# Patient Record
Sex: Male | Born: 1960 | Race: Black or African American | Hispanic: No | Marital: Single | Smoking: Current some day smoker
Health system: Southern US, Community
[De-identification: ages and names within clinical notes are randomized; demographics above are authoritative.]

## PROBLEM LIST (undated history)

## (undated) DIAGNOSIS — S92001B Unspecified fracture of right calcaneus, initial encounter for open fracture: Secondary | ICD-10-CM

## (undated) DIAGNOSIS — S62309A Unspecified fracture of unspecified metacarpal bone, initial encounter for closed fracture: Secondary | ICD-10-CM

## (undated) DIAGNOSIS — S82201A Unspecified fracture of shaft of right tibia, initial encounter for closed fracture: Secondary | ICD-10-CM

## (undated) DIAGNOSIS — S82891A Other fracture of right lower leg, initial encounter for closed fracture: Secondary | ICD-10-CM

## (undated) DIAGNOSIS — I10 Essential (primary) hypertension: Secondary | ICD-10-CM

## (undated) HISTORY — PX: PROSTATE SURGERY: SHX751

---

## 1997-12-31 ENCOUNTER — Encounter: Admission: RE | Admit: 1997-12-31 | Discharge: 1998-02-17 | Payer: Self-pay | Admitting: Anesthesiology

## 2000-04-18 ENCOUNTER — Emergency Department (HOSPITAL_COMMUNITY): Admission: EM | Admit: 2000-04-18 | Discharge: 2000-04-18 | Payer: Self-pay | Admitting: Emergency Medicine

## 2000-04-18 ENCOUNTER — Encounter: Payer: Self-pay | Admitting: Emergency Medicine

## 2003-03-27 ENCOUNTER — Ambulatory Visit (HOSPITAL_COMMUNITY): Admission: RE | Admit: 2003-03-27 | Discharge: 2003-03-27 | Payer: Self-pay | Admitting: Gastroenterology

## 2014-12-02 ENCOUNTER — Emergency Department (HOSPITAL_COMMUNITY): Payer: 59

## 2014-12-02 ENCOUNTER — Inpatient Hospital Stay (HOSPITAL_COMMUNITY)
Admission: EM | Admit: 2014-12-02 | Discharge: 2014-12-05 | DRG: 493 | Disposition: A | Discharge status: Home / Self Care | Payer: 59 | Attending: Orthopedic Surgery | Admitting: Orthopedic Surgery

## 2014-12-02 ENCOUNTER — Encounter (HOSPITAL_COMMUNITY): Payer: Self-pay | Admitting: Emergency Medicine

## 2014-12-02 DIAGNOSIS — F17290 Nicotine dependence, other tobacco product, uncomplicated: Secondary | ICD-10-CM | POA: Diagnosis present

## 2014-12-02 DIAGNOSIS — S82391A Other fracture of lower end of right tibia, initial encounter for closed fracture: Secondary | ICD-10-CM | POA: Diagnosis not present

## 2014-12-02 DIAGNOSIS — S82891A Other fracture of right lower leg, initial encounter for closed fracture: Secondary | ICD-10-CM | POA: Diagnosis present

## 2014-12-02 DIAGNOSIS — S8261XA Displaced fracture of lateral malleolus of right fibula, initial encounter for closed fracture: Secondary | ICD-10-CM | POA: Diagnosis present

## 2014-12-02 DIAGNOSIS — S62309A Unspecified fracture of unspecified metacarpal bone, initial encounter for closed fracture: Secondary | ICD-10-CM | POA: Diagnosis present

## 2014-12-02 DIAGNOSIS — S82209A Unspecified fracture of shaft of unspecified tibia, initial encounter for closed fracture: Secondary | ICD-10-CM

## 2014-12-02 DIAGNOSIS — S82201A Unspecified fracture of shaft of right tibia, initial encounter for closed fracture: Secondary | ICD-10-CM | POA: Diagnosis present

## 2014-12-02 DIAGNOSIS — R52 Pain, unspecified: Secondary | ICD-10-CM

## 2014-12-02 DIAGNOSIS — S92001B Unspecified fracture of right calcaneus, initial encounter for open fracture: Secondary | ICD-10-CM | POA: Diagnosis present

## 2014-12-02 DIAGNOSIS — Z419 Encounter for procedure for purposes other than remedying health state, unspecified: Secondary | ICD-10-CM

## 2014-12-02 DIAGNOSIS — F17210 Nicotine dependence, cigarettes, uncomplicated: Secondary | ICD-10-CM | POA: Diagnosis present

## 2014-12-02 DIAGNOSIS — S62202A Unspecified fracture of first metacarpal bone, left hand, initial encounter for closed fracture: Secondary | ICD-10-CM | POA: Diagnosis present

## 2014-12-02 DIAGNOSIS — Z79899 Other long term (current) drug therapy: Secondary | ICD-10-CM

## 2014-12-02 DIAGNOSIS — S92041B Displaced other fracture of tuberosity of right calcaneus, initial encounter for open fracture: Secondary | ICD-10-CM | POA: Diagnosis present

## 2014-12-02 DIAGNOSIS — I10 Essential (primary) hypertension: Secondary | ICD-10-CM | POA: Diagnosis present

## 2014-12-02 HISTORY — DX: Essential (primary) hypertension: I10

## 2014-12-02 HISTORY — DX: Unspecified fracture of right calcaneus, initial encounter for open fracture: S92.001B

## 2014-12-02 HISTORY — DX: Other fracture of right lower leg, initial encounter for closed fracture: S82.891A

## 2014-12-02 HISTORY — DX: Unspecified fracture of unspecified metacarpal bone, initial encounter for closed fracture: S62.309A

## 2014-12-02 HISTORY — DX: Unspecified fracture of shaft of right tibia, initial encounter for closed fracture: S82.201A

## 2014-12-02 LAB — I-STAT CHEM 8, ED
BUN: 17 mg/dL (ref 6–20)
Calcium, Ion: 1.17 mmol/L (ref 1.12–1.23)
Chloride: 103 mmol/L (ref 101–111)
Creatinine, Ser: 1.6 mg/dL — ABNORMAL HIGH (ref 0.61–1.24)
Glucose, Bld: 123 mg/dL — ABNORMAL HIGH (ref 65–99)
HCT: 45 % (ref 39.0–52.0)
Hemoglobin: 15.3 g/dL (ref 13.0–17.0)
Potassium: 3.6 mmol/L (ref 3.5–5.1)
Sodium: 142 mmol/L (ref 135–145)
TCO2: 25 mmol/L (ref 0–100)

## 2014-12-02 LAB — CBC WITH DIFFERENTIAL/PLATELET
BASOS PCT: 0 % (ref 0–1)
Basophils Absolute: 0 10*3/uL (ref 0.0–0.1)
EOS ABS: 0.1 10*3/uL (ref 0.0–0.7)
EOS PCT: 1 % (ref 0–5)
HCT: 43.4 % (ref 39.0–52.0)
HEMOGLOBIN: 15.4 g/dL (ref 13.0–17.0)
LYMPHS ABS: 2.4 10*3/uL (ref 0.7–4.0)
Lymphocytes Relative: 22 % (ref 12–46)
MCH: 32.7 pg (ref 26.0–34.0)
MCHC: 35.5 g/dL (ref 30.0–36.0)
MCV: 92.1 fL (ref 78.0–100.0)
MONO ABS: 0.8 10*3/uL (ref 0.1–1.0)
MONOS PCT: 8 % (ref 3–12)
Neutro Abs: 7.3 10*3/uL (ref 1.7–7.7)
Neutrophils Relative %: 69 % (ref 43–77)
Platelets: 155 10*3/uL (ref 150–400)
RBC: 4.71 MIL/uL (ref 4.22–5.81)
RDW: 14 % (ref 11.5–15.5)
WBC: 10.6 10*3/uL — ABNORMAL HIGH (ref 4.0–10.5)

## 2014-12-02 MED ORDER — HYDROMORPHONE HCL 1 MG/ML IJ SOLN
1.0000 mg | Freq: Once | INTRAMUSCULAR | Status: AC
  Administered 2014-12-02: 1 mg via INTRAVENOUS
  Filled 2014-12-02: qty 1

## 2014-12-02 MED ORDER — ONDANSETRON HCL 4 MG/2ML IJ SOLN
4.0000 mg | Freq: Once | INTRAMUSCULAR | Status: AC
  Administered 2014-12-02: 4 mg via INTRAVENOUS
  Filled 2014-12-02: qty 2

## 2014-12-02 MED ORDER — IOHEXOL 300 MG/ML  SOLN
100.0000 mL | Freq: Once | INTRAMUSCULAR | Status: AC | PRN
  Administered 2014-12-02: 100 mL via INTRAVENOUS

## 2014-12-02 MED ORDER — SODIUM CHLORIDE 0.9 % IV BOLUS (SEPSIS)
500.0000 mL | Freq: Once | INTRAVENOUS | Status: AC
  Administered 2014-12-02: 500 mL via INTRAVENOUS

## 2014-12-02 MED ORDER — METHOCARBAMOL 500 MG PO TABS
1000.0000 mg | ORAL_TABLET | Freq: Once | ORAL | Status: AC
  Administered 2014-12-02: 1000 mg via ORAL
  Filled 2014-12-02: qty 2

## 2014-12-02 NOTE — ED Notes (Signed)
Ortho tech at bedside stabilizing right lower leg.

## 2014-12-02 NOTE — ED Notes (Signed)
Patient transported to X-ray & CT °
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
## 2014-12-02 NOTE — Progress Notes (Signed)
Orthopedic Tech Progress Note Patient Details:  John Cantu 1961/02/24 [id number]  Ortho Devices Type of Ortho Device: Thumb spica splint Splint Material: Fiberglass Ortho Device/Splint Location: LUE Ortho Device/Splint Interventions: Ordered, Application   [name] 12/02/2014, 10:50 PM

## 2014-12-02 NOTE — Progress Notes (Signed)
Orthopedic Tech Progress Note Patient Details:  John Cantu 05-10-1961 [id number]  Ortho Devices Type of Ortho Device: Ace wrap, Post (short leg) splint, Stirrup splint Ortho Device/Splint Location: RLE Ortho Device/Splint Interventions: Ordered, Application   [name] 12/02/2014, 6:06 PM

## 2014-12-02 NOTE — ED Notes (Signed)
Cleared from backboard and c-collar by Dr. Estell Harpin.

## 2014-12-02 NOTE — ED Notes (Signed)
Toes warm; dorsalis pedis pulse intact after splinting.

## 2014-12-02 NOTE — ED Notes (Signed)
Remains in Radiology
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
## 2014-12-03 ENCOUNTER — Encounter (HOSPITAL_COMMUNITY)
Admission: EM | Disposition: A | Discharge status: Home / Self Care | Payer: Self-pay | Source: Home / Self Care | Attending: Orthopedic Surgery

## 2014-12-03 ENCOUNTER — Inpatient Hospital Stay (HOSPITAL_COMMUNITY): Payer: 59

## 2014-12-03 ENCOUNTER — Inpatient Hospital Stay (HOSPITAL_COMMUNITY): Payer: 59 | Admitting: Anesthesiology

## 2014-12-03 DIAGNOSIS — S62202A Unspecified fracture of first metacarpal bone, left hand, initial encounter for closed fracture: Secondary | ICD-10-CM | POA: Diagnosis present

## 2014-12-03 DIAGNOSIS — F17210 Nicotine dependence, cigarettes, uncomplicated: Secondary | ICD-10-CM | POA: Diagnosis present

## 2014-12-03 DIAGNOSIS — S82391A Other fracture of lower end of right tibia, initial encounter for closed fracture: Secondary | ICD-10-CM | POA: Diagnosis present

## 2014-12-03 DIAGNOSIS — Z79899 Other long term (current) drug therapy: Secondary | ICD-10-CM | POA: Diagnosis not present

## 2014-12-03 DIAGNOSIS — I10 Essential (primary) hypertension: Secondary | ICD-10-CM | POA: Diagnosis present

## 2014-12-03 DIAGNOSIS — S92041B Displaced other fracture of tuberosity of right calcaneus, initial encounter for open fracture: Secondary | ICD-10-CM | POA: Diagnosis present

## 2014-12-03 DIAGNOSIS — F17290 Nicotine dependence, other tobacco product, uncomplicated: Secondary | ICD-10-CM | POA: Diagnosis present

## 2014-12-03 DIAGNOSIS — S8261XA Displaced fracture of lateral malleolus of right fibula, initial encounter for closed fracture: Secondary | ICD-10-CM | POA: Diagnosis present

## 2014-12-03 DIAGNOSIS — R52 Pain, unspecified: Secondary | ICD-10-CM | POA: Diagnosis present

## 2014-12-03 HISTORY — PX: ORIF FIBULA FRACTURE: SHX5114

## 2014-12-03 HISTORY — PX: IRRIGATION AND DEBRIDEMENT FOOT: SHX6602

## 2014-12-03 HISTORY — PX: TIBIA IM NAIL INSERTION: SHX2516

## 2014-12-03 LAB — COMPREHENSIVE METABOLIC PANEL
ALK PHOS: 62 U/L (ref 38–126)
ALT: 35 U/L (ref 17–63)
ANION GAP: 9 (ref 5–15)
AST: 32 U/L (ref 15–41)
Albumin: 3.9 g/dL (ref 3.5–5.0)
BILIRUBIN TOTAL: 0.8 mg/dL (ref 0.3–1.2)
BUN: 14 mg/dL (ref 6–20)
CHLORIDE: 105 mmol/L (ref 101–111)
CO2: 26 mmol/L (ref 22–32)
Calcium: 9.1 mg/dL (ref 8.9–10.3)
Creatinine, Ser: 1.34 mg/dL — ABNORMAL HIGH (ref 0.61–1.24)
GFR calc non Af Amer: 59 mL/min — ABNORMAL LOW (ref >60)
Glucose, Bld: 169 mg/dL — ABNORMAL HIGH (ref 65–99)
POTASSIUM: 3.9 mmol/L (ref 3.5–5.1)
Sodium: 140 mmol/L (ref 135–145)
Total Protein: 6.7 g/dL (ref 6.5–8.1)

## 2014-12-03 LAB — CBC WITH DIFFERENTIAL/PLATELET
BASOS ABS: 0 10*3/uL (ref 0.0–0.1)
BASOS PCT: 0 % (ref 0–1)
EOS ABS: 0 10*3/uL (ref 0.0–0.7)
Eosinophils Relative: 0 % (ref 0–5)
HCT: 42.8 % (ref 39.0–52.0)
Hemoglobin: 15.1 g/dL (ref 13.0–17.0)
Lymphocytes Relative: 11 % — ABNORMAL LOW (ref 12–46)
Lymphs Abs: 1.3 10*3/uL (ref 0.7–4.0)
MCH: 32.8 pg (ref 26.0–34.0)
MCHC: 35.3 g/dL (ref 30.0–36.0)
MCV: 93 fL (ref 78.0–100.0)
Monocytes Absolute: 0.9 10*3/uL (ref 0.1–1.0)
Monocytes Relative: 8 % (ref 3–12)
NEUTROS PCT: 81 % — AB (ref 43–77)
Neutro Abs: 9.8 10*3/uL — ABNORMAL HIGH (ref 1.7–7.7)
Platelets: 168 10*3/uL (ref 150–400)
RBC: 4.6 MIL/uL (ref 4.22–5.81)
RDW: 14.3 % (ref 11.5–15.5)
WBC: 12.1 10*3/uL — ABNORMAL HIGH (ref 4.0–10.5)

## 2014-12-03 LAB — PROTIME-INR
INR: 1.2 (ref 0.00–1.49)
PROTHROMBIN TIME: 15.3 s — AB (ref 11.6–15.2)

## 2014-12-03 LAB — APTT: aPTT: 28 seconds (ref 24–37)

## 2014-12-03 LAB — SURGICAL PCR SCREEN
MRSA, PCR: NEGATIVE
Staphylococcus aureus: NEGATIVE

## 2014-12-03 SURGERY — INSERTION, INTRAMEDULLARY ROD, TIBIA
Anesthesia: General | Site: Leg Lower | Laterality: Right

## 2014-12-03 MED ORDER — PROMETHAZINE HCL 25 MG/ML IJ SOLN: 6.2500 mg | INTRAMUSCULAR | Status: DC | PRN

## 2014-12-03 MED ORDER — HYDROMORPHONE HCL 1 MG/ML IJ SOLN: 0.5000 mg | INTRAMUSCULAR | Status: DC | PRN

## 2014-12-03 MED ORDER — METOCLOPRAMIDE HCL 5 MG/ML IJ SOLN: 5.0000 mg | Freq: Three times a day (TID) | INTRAMUSCULAR | Status: DC | PRN

## 2014-12-03 MED ORDER — POLYETHYLENE GLYCOL 3350 17 G PO PACK: 17.0000 g | PACK | Freq: Every day | ORAL | Status: DC

## 2014-12-03 MED ORDER — POLYETHYLENE GLYCOL 3350 17 G PO PACK
17.0000 g | PACK | Freq: Every day | ORAL | Status: DC
  Administered 2014-12-05: 17 g via ORAL
  Filled 2014-12-03 (×2): qty 1

## 2014-12-03 MED ORDER — CEFAZOLIN SODIUM 1-5 GM-% IV SOLN
INTRAVENOUS | Status: AC
  Filled 2014-12-03: qty 50

## 2014-12-03 MED ORDER — HYDROMORPHONE HCL 1 MG/ML IJ SOLN
1.0000 mg | INTRAMUSCULAR | Status: DC | PRN
  Administered 2014-12-03: 1 mg via INTRAVENOUS
  Filled 2014-12-03: qty 1

## 2014-12-03 MED ORDER — SODIUM CHLORIDE 0.9 % IR SOLN
Status: DC | PRN
  Administered 2014-12-03: 3000 mL

## 2014-12-03 MED ORDER — MIDAZOLAM HCL 5 MG/5ML IJ SOLN
INTRAMUSCULAR | Status: DC | PRN
  Administered 2014-12-03: 2 mg via INTRAVENOUS

## 2014-12-03 MED ORDER — POTASSIUM CHLORIDE IN NACL 20-0.9 MEQ/L-% IV SOLN
INTRAVENOUS | Status: DC
  Administered 2014-12-04: 01:00:00 via INTRAVENOUS
  Filled 2014-12-03 (×2): qty 1000

## 2014-12-03 MED ORDER — ONDANSETRON HCL 4 MG/2ML IJ SOLN: 4.0000 mg | Freq: Four times a day (QID) | INTRAMUSCULAR | Status: DC | PRN

## 2014-12-03 MED ORDER — METHOCARBAMOL 500 MG PO TABS
1000.0000 mg | ORAL_TABLET | Freq: Three times a day (TID) | ORAL | Status: DC
  Administered 2014-12-04 (×5): 1000 mg via ORAL
  Filled 2014-12-03 (×5): qty 2

## 2014-12-03 MED ORDER — OXYCODONE HCL 5 MG PO TABS: 5.0000 mg | ORAL_TABLET | ORAL | Status: DC | PRN

## 2014-12-03 MED ORDER — HYDROMORPHONE 0.3 MG/ML IV SOLN
INTRAVENOUS | Status: DC
  Administered 2014-12-03: 22:00:00 via INTRAVENOUS
  Administered 2014-12-04: 1.5 mg via INTRAVENOUS

## 2014-12-03 MED ORDER — DIPHENHYDRAMINE HCL 50 MG/ML IJ SOLN: 12.5000 mg | Freq: Four times a day (QID) | INTRAMUSCULAR | Status: DC | PRN

## 2014-12-03 MED ORDER — FENTANYL CITRATE (PF) 100 MCG/2ML IJ SOLN
INTRAMUSCULAR | Status: DC | PRN
  Administered 2014-12-03: 50 ug via INTRAVENOUS
  Administered 2014-12-03 (×2): 100 ug via INTRAVENOUS

## 2014-12-03 MED ORDER — NALOXONE HCL 0.4 MG/ML IJ SOLN: 0.4000 mg | INTRAMUSCULAR | Status: DC | PRN

## 2014-12-03 MED ORDER — ACETAMINOPHEN 650 MG RE SUPP: 650.0000 mg | Freq: Four times a day (QID) | RECTAL | Status: DC

## 2014-12-03 MED ORDER — ONDANSETRON HCL 4 MG/2ML IJ SOLN
INTRAMUSCULAR | Status: DC | PRN
  Administered 2014-12-03: 4 mg via INTRAVENOUS

## 2014-12-03 MED ORDER — ONDANSETRON HCL 4 MG PO TABS: 4.0000 mg | ORAL_TABLET | Freq: Four times a day (QID) | ORAL | Status: DC | PRN

## 2014-12-03 MED ORDER — ENOXAPARIN SODIUM 40 MG/0.4ML ~~LOC~~ SOLN
40.0000 mg | SUBCUTANEOUS | Status: DC
  Administered 2014-12-04 – 2014-12-05 (×2): 40 mg via SUBCUTANEOUS
  Filled 2014-12-03 (×2): qty 0.4

## 2014-12-03 MED ORDER — ONDANSETRON HCL 4 MG/2ML IJ SOLN
4.0000 mg | Freq: Four times a day (QID) | INTRAMUSCULAR | Status: DC | PRN
Start: 2014-12-03 — End: 2014-12-05

## 2014-12-03 MED ORDER — ACETAMINOPHEN 500 MG PO TABS
1000.0000 mg | ORAL_TABLET | Freq: Four times a day (QID) | ORAL | Status: DC
  Administered 2014-12-04 – 2014-12-05 (×7): 1000 mg via ORAL
  Filled 2014-12-03 (×7): qty 2

## 2014-12-03 MED ORDER — BISACODYL 5 MG PO TBEC: 5.0000 mg | DELAYED_RELEASE_TABLET | Freq: Every day | ORAL | Status: DC | PRN

## 2014-12-03 MED ORDER — LIDOCAINE HCL (CARDIAC) 20 MG/ML IV SOLN
INTRAVENOUS | Status: DC | PRN
  Administered 2014-12-03: 80 mg via INTRAVENOUS

## 2014-12-03 MED ORDER — MIDAZOLAM HCL 2 MG/2ML IJ SOLN
INTRAMUSCULAR | Status: AC
  Filled 2014-12-03: qty 2

## 2014-12-03 MED ORDER — METOCLOPRAMIDE HCL 5 MG PO TABS: 5.0000 mg | ORAL_TABLET | Freq: Three times a day (TID) | ORAL | Status: DC | PRN

## 2014-12-03 MED ORDER — CEFAZOLIN SODIUM-DEXTROSE 2-3 GM-% IV SOLR
2.0000 g | INTRAVENOUS | Status: DC
  Filled 2014-12-03: qty 50

## 2014-12-03 MED ORDER — DOCUSATE SODIUM 100 MG PO CAPS
100.0000 mg | ORAL_CAPSULE | Freq: Two times a day (BID) | ORAL | Status: DC
  Administered 2014-12-04 – 2014-12-05 (×3): 100 mg via ORAL
  Filled 2014-12-03 (×3): qty 1

## 2014-12-03 MED ORDER — DOCUSATE SODIUM 100 MG PO CAPS
100.0000 mg | ORAL_CAPSULE | Freq: Two times a day (BID) | ORAL | Status: DC
  Administered 2014-12-03: 100 mg via ORAL
  Filled 2014-12-03: qty 1

## 2014-12-03 MED ORDER — LACTATED RINGERS IV SOLN
INTRAVENOUS | Status: DC
Start: 2014-12-03 — End: 2014-12-03
  Administered 2014-12-03: 02:00:00 via INTRAVENOUS

## 2014-12-03 MED ORDER — PROPOFOL 10 MG/ML IV BOLUS
INTRAVENOUS | Status: DC | PRN
  Administered 2014-12-03: 200 mg via INTRAVENOUS

## 2014-12-03 MED ORDER — CEFAZOLIN SODIUM-DEXTROSE 2-3 GM-% IV SOLR
2.0000 g | INTRAVENOUS | Status: AC
  Administered 2014-12-03: 2 g via INTRAVENOUS
  Administered 2014-12-03: 1 g via INTRAVENOUS
  Filled 2014-12-03: qty 50

## 2014-12-03 MED ORDER — MAGNESIUM CITRATE PO SOLN: 1.0000 | Freq: Once | ORAL | Status: DC | PRN

## 2014-12-03 MED ORDER — OXYCODONE HCL 5 MG PO TABS
5.0000 mg | ORAL_TABLET | ORAL | Status: DC | PRN
  Filled 2014-12-03: qty 2

## 2014-12-03 MED ORDER — ROCURONIUM BROMIDE 100 MG/10ML IV SOLN
INTRAVENOUS | Status: DC | PRN
  Administered 2014-12-03: 50 mg via INTRAVENOUS

## 2014-12-03 MED ORDER — 0.9 % SODIUM CHLORIDE (POUR BTL) OPTIME
TOPICAL | Status: DC | PRN
  Administered 2014-12-03: 1000 mL

## 2014-12-03 MED ORDER — SODIUM CHLORIDE 0.9 % IJ SOLN: 9.0000 mL | INTRAMUSCULAR | Status: DC | PRN

## 2014-12-03 MED ORDER — BISACODYL 5 MG PO TBEC
5.0000 mg | DELAYED_RELEASE_TABLET | Freq: Every day | ORAL | Status: DC | PRN
  Filled 2014-12-03: qty 1

## 2014-12-03 MED ORDER — MEPERIDINE HCL 25 MG/ML IJ SOLN
6.2500 mg | INTRAMUSCULAR | Status: DC | PRN
  Administered 2014-12-03: 6.25 mg via INTRAVENOUS
  Filled 2014-12-03: qty 1

## 2014-12-03 MED ORDER — NEOSTIGMINE METHYLSULFATE 10 MG/10ML IV SOLN
INTRAVENOUS | Status: DC | PRN
  Administered 2014-12-03: 5 mg via INTRAVENOUS

## 2014-12-03 MED ORDER — DEXTROSE 5 % IV SOLN
7.0000 mg/kg | INTRAVENOUS | Status: DC
  Administered 2014-12-04 – 2014-12-05 (×2): 700 mg via INTRAVENOUS
  Filled 2014-12-03 (×3): qty 17.5

## 2014-12-03 MED ORDER — HYDROMORPHONE 0.3 MG/ML IV SOLN
INTRAVENOUS | Status: AC
  Filled 2014-12-03: qty 25

## 2014-12-03 MED ORDER — MEPERIDINE HCL 25 MG/ML IJ SOLN
INTRAMUSCULAR | Status: AC
  Administered 2014-12-03: 6.25 mg via INTRAVENOUS
  Filled 2014-12-03: qty 1

## 2014-12-03 MED ORDER — FENTANYL CITRATE (PF) 250 MCG/5ML IJ SOLN
INTRAMUSCULAR | Status: AC
  Filled 2014-12-03: qty 5

## 2014-12-03 MED ORDER — CEFAZOLIN SODIUM-DEXTROSE 2-3 GM-% IV SOLR
2.0000 g | Freq: Four times a day (QID) | INTRAVENOUS | Status: DC
  Administered 2014-12-04 – 2014-12-05 (×7): 2 g via INTRAVENOUS
  Filled 2014-12-03 (×9): qty 50

## 2014-12-03 MED ORDER — MAGNESIUM CITRATE PO SOLN: 1.0000 | Freq: Once | ORAL | Status: AC | PRN

## 2014-12-03 MED ORDER — GLYCOPYRROLATE 0.2 MG/ML IJ SOLN
INTRAMUSCULAR | Status: DC | PRN
  Administered 2014-12-03: .8 mg via INTRAVENOUS

## 2014-12-03 MED ORDER — DIPHENHYDRAMINE HCL 12.5 MG/5ML PO ELIX: 12.5000 mg | ORAL_SOLUTION | Freq: Four times a day (QID) | ORAL | Status: DC | PRN

## 2014-12-03 MED ORDER — ACETAMINOPHEN 500 MG PO TABS
1000.0000 mg | ORAL_TABLET | Freq: Four times a day (QID) | ORAL | Status: DC
Start: 2014-12-03 — End: 2014-12-03
  Administered 2014-12-03 (×3): 1000 mg via ORAL
  Filled 2014-12-03 (×2): qty 2

## 2014-12-03 MED ORDER — DEXAMETHASONE SODIUM PHOSPHATE 10 MG/ML IJ SOLN
INTRAMUSCULAR | Status: DC | PRN
  Administered 2014-12-03: 10 mg via INTRAVENOUS

## 2014-12-03 MED ORDER — HYDROMORPHONE HCL 1 MG/ML IJ SOLN: 0.2500 mg | INTRAMUSCULAR | Status: DC | PRN

## 2014-12-03 MED ORDER — LACTATED RINGERS IV SOLN
INTRAVENOUS | Status: DC
  Administered 2014-12-03 (×2): via INTRAVENOUS

## 2014-12-03 MED ORDER — ALUM & MAG HYDROXIDE-SIMETH 200-200-20 MG/5ML PO SUSP: 30.0000 mL | Freq: Four times a day (QID) | ORAL | Status: DC | PRN

## 2014-12-03 MED ORDER — PROMETHAZINE HCL 25 MG PO TABS
12.5000 mg | ORAL_TABLET | Freq: Four times a day (QID) | ORAL | Status: DC | PRN
  Filled 2014-12-03: qty 1

## 2014-12-03 MED ORDER — PROPOFOL 10 MG/ML IV BOLUS
INTRAVENOUS | Status: AC
  Filled 2014-12-03: qty 20

## 2014-12-03 MED ORDER — DEXTROSE 5 % IV SOLN
500.0000 mg | Freq: Three times a day (TID) | INTRAVENOUS | Status: DC
  Administered 2014-12-05 (×2): 500 mg via INTRAVENOUS
  Filled 2014-12-03 (×9): qty 5

## 2014-12-03 SURGICAL SUPPLY — 80 items
BANDAGE ELASTIC 4 VELCRO ST LF (GAUZE/BANDAGES/DRESSINGS) ×4 IMPLANT
BANDAGE ELASTIC 6 VELCRO ST LF (GAUZE/BANDAGES/DRESSINGS) ×4 IMPLANT
BIT DRILL 110X2.5XQCK CNCT (BIT) ×2 IMPLANT
BIT DRILL 2.5 (BIT) ×2
BIT DRILL 3.8X6 NS (BIT) ×4 IMPLANT
BIT DRILL 4.4 NS (BIT) ×4 IMPLANT
BIT DRILL STD 2.0MM (DRILL) ×2 IMPLANT
BIT DRL 110X2.5XQCK CNCT (BIT) ×2
BLADE SURG 10 STRL SS (BLADE) ×12 IMPLANT
BNDG GAUZE ELAST 4 BULKY (GAUZE/BANDAGES/DRESSINGS) ×4 IMPLANT
BRUSH SCRUB DISP (MISCELLANEOUS) ×8 IMPLANT
CLOSURE WOUND 1/2 X4 (GAUZE/BANDAGES/DRESSINGS) ×1
COVER SURGICAL LIGHT HANDLE (MISCELLANEOUS) ×8 IMPLANT
DRAPE C-ARM 42X72 X-RAY (DRAPES) ×4 IMPLANT
DRAPE C-ARMOR (DRAPES) ×4 IMPLANT
DRAPE EXTREMITY T 121X128X90 (DRAPE) ×4 IMPLANT
DRAPE IMP U-DRAPE 54X76 (DRAPES) ×4 IMPLANT
DRAPE INCISE IOBAN 66X45 STRL (DRAPES) IMPLANT
DRAPE U-SHAPE 47X51 STRL (DRAPES) ×4 IMPLANT
DRILL STANDARD 2.0MM (DRILL) ×4
DRSG ADAPTIC 3X8 NADH LF (GAUZE/BANDAGES/DRESSINGS) ×4 IMPLANT
DRSG MEPITEL 4X7.2 (GAUZE/BANDAGES/DRESSINGS) ×8 IMPLANT
DRSG PAD ABDOMINAL 8X10 ST (GAUZE/BANDAGES/DRESSINGS) ×4 IMPLANT
ELECT REM PT RETURN 9FT ADLT (ELECTROSURGICAL) ×4
ELECTRODE REM PT RTRN 9FT ADLT (ELECTROSURGICAL) ×2 IMPLANT
EVACUATOR 1/8 PVC DRAIN (DRAIN) IMPLANT
GAUZE SPONGE 4X4 12PLY STRL (GAUZE/BANDAGES/DRESSINGS) ×4 IMPLANT
GLOVE BIO SURGEON STRL SZ7.5 (GLOVE) ×4 IMPLANT
GLOVE BIO SURGEON STRL SZ8 (GLOVE) ×4 IMPLANT
GLOVE BIO SURGEON STRL SZ8.5 (GLOVE) ×4 IMPLANT
GLOVE BIOGEL PI IND STRL 7.5 (GLOVE) ×2 IMPLANT
GLOVE BIOGEL PI IND STRL 8 (GLOVE) ×2 IMPLANT
GLOVE BIOGEL PI INDICATOR 7.5 (GLOVE) ×2
GLOVE BIOGEL PI INDICATOR 8 (GLOVE) ×2
GOWN STRL REUS W/ TWL LRG LVL3 (GOWN DISPOSABLE) ×4 IMPLANT
GOWN STRL REUS W/ TWL XL LVL3 (GOWN DISPOSABLE) ×2 IMPLANT
GOWN STRL REUS W/TWL LRG LVL3 (GOWN DISPOSABLE) ×4
GOWN STRL REUS W/TWL XL LVL3 (GOWN DISPOSABLE) ×2
GUIDEWIRE BALL NOSE 80CM (WIRE) ×4 IMPLANT
KIT BASIN OR (CUSTOM PROCEDURE TRAY) ×4 IMPLANT
KIT ROOM TURNOVER OR (KITS) ×4 IMPLANT
NAIL TIBIAL 10X40.5 (Nail) ×4 IMPLANT
PACK GENERAL/GYN (CUSTOM PROCEDURE TRAY) ×4 IMPLANT
PACK UNIVERSAL I (CUSTOM PROCEDURE TRAY) ×4 IMPLANT
PAD ARMBOARD 7.5X6 YLW CONV (MISCELLANEOUS) ×8 IMPLANT
PAD CAST 4YDX4 CTTN HI CHSV (CAST SUPPLIES) ×2 IMPLANT
PADDING CAST COTTON 4X4 STRL (CAST SUPPLIES) ×2
PADDING CAST COTTON 6X4 STRL (CAST SUPPLIES) ×4 IMPLANT
PLATE TIBULA RT PERIART 4H (Plate) ×4 IMPLANT
Pericortical screw 3.5 mm sized 20 (Screw) ×4 IMPLANT
SCREW 3.5X16MM (Screw) ×2 IMPLANT
SCREW ACECAP 40MM (Screw) ×4 IMPLANT
SCREW ACECAP 52MM (Screw) ×4 IMPLANT
SCREW BN 2.7X16X3.5XST NS (Screw) ×2 IMPLANT
SCREW CORTICAL 3.5X18 (Screw) ×4 IMPLANT
SCREW LOCK 16X2.7X (Screw) ×4 IMPLANT
SCREW LOCKING 2.7MMX20MM (Screw) ×8 IMPLANT
SCREW LOCKING 2.7X16 (Screw) ×4 IMPLANT
SCREW PERI CORTICAL 3.5X20MM (Screw) ×4 IMPLANT
SCREW PROXIMAL 4.5MMX18MM (Screw) ×4 IMPLANT
SCREW PROXIMAL DEPUY (Screw) ×2 IMPLANT
SCREW PRXML FT 50X5.5XLCK NS (Screw) ×2 IMPLANT
SET CYSTO W/LG BORE CLAMP LF (SET/KITS/TRAYS/PACK) ×4 IMPLANT
SPONGE GAUZE 4X4 12PLY STER LF (GAUZE/BANDAGES/DRESSINGS) ×8 IMPLANT
SPONGE LAP 18X18 X RAY DECT (DISPOSABLE) ×4 IMPLANT
STAPLER VISISTAT 35W (STAPLE) ×4 IMPLANT
STRIP CLOSURE SKIN 1/2X4 (GAUZE/BANDAGES/DRESSINGS) ×3 IMPLANT
SUT ETHILON 3 0 PS 1 (SUTURE) ×12 IMPLANT
SUT PDS AB 2-0 CT1 27 (SUTURE) ×8 IMPLANT
SUT VIC AB 0 CT1 27 (SUTURE) ×2
SUT VIC AB 0 CT1 27XBRD ANBCTR (SUTURE) ×2 IMPLANT
SUT VIC AB 2-0 CT1 27 (SUTURE) ×2
SUT VIC AB 2-0 CT1 TAPERPNT 27 (SUTURE) ×2 IMPLANT
SUT VIC AB 2-0 CT3 27 (SUTURE) IMPLANT
TOWEL OR 17X24 6PK STRL BLUE (TOWEL DISPOSABLE) ×4 IMPLANT
TOWEL OR 17X26 10 PK STRL BLUE (TOWEL DISPOSABLE) ×12 IMPLANT
TRAY FOLEY CATH 16FRSI W/METER (SET/KITS/TRAYS/PACK) ×4 IMPLANT
TUBE CONNECTING 12'X1/4 (SUCTIONS) ×1
TUBE CONNECTING 12X1/4 (SUCTIONS) ×3 IMPLANT
YANKAUER SUCT BULB TIP NO VENT (SUCTIONS) ×4 IMPLANT

## 2014-12-03 NOTE — Progress Notes (Signed)
Utilization review complete. Madalaine Portier RN CCM Case Mgmt phone [phone] 
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
## 2014-12-03 NOTE — Anesthesia Postprocedure Evaluation (Signed)
  Anesthesia Post-op Note  Patient: John Cantu  Procedure(s) Performed: Procedure(s): INTRAMEDULLARY (IM) NAIL TIBIAL (Right) OPEN REDUCTION INTERNAL FIXATION (ORIF) FIBULA FRACTURE (Right) IRRIGATION AND DEBRIDEMENT CALCANEOUS FRACTURE (Right)  Patient Location: PACU  Anesthesia Type:General  Level of Consciousness: awake, oriented, sedated and patient cooperative  Airway and Oxygen Therapy: Patient Spontanous Breathing  Post-op Pain: mild  Post-op Assessment: Post-op Vital signs reviewed, Patient's Cardiovascular Status Stable, Respiratory Function Stable, Patent Airway, No signs of Nausea or vomiting and Pain level controlled              Post-op Vital Signs: stable  Last Vitals:  Filed Vitals:   12/03/14 2155  BP: 145/83  Pulse: 81  Temp:   Resp: 15    Complications: No apparent anesthesia complications

## 2014-12-03 NOTE — Anesthesia Procedure Notes (Signed)
Procedure Name: Intubation Date/Time: 12/03/2014 5:53 PM Performed by: Renford Dills Pre-anesthesia Checklist: Patient identified, Emergency Drugs available, Suction available and Patient being monitored Patient Re-evaluated:Patient Re-evaluated prior to inductionOxygen Delivery Method: Circle system utilized Preoxygenation: Pre-oxygenation with 100% oxygen Intubation Type: IV induction Ventilation: Mask ventilation without difficulty Laryngoscope Size: Miller, 2 and 3 Grade View: Grade II Tube type: Oral Tube size: 8.0 mm Number of attempts: 2 Airway Equipment and Method: Stylet Placement Confirmation: ETT inserted through vocal cords under direct vision,  positive ETCO2 and breath sounds checked- equal and bilateral Secured at: 24 cm Dental Injury: Teeth and Oropharynx as per pre-operative assessment

## 2014-12-03 NOTE — Anesthesia Preprocedure Evaluation (Signed)
Anesthesia Evaluation  Patient identified by MRN, date of birth, ID band Patient awake    Reviewed: Allergy & Precautions, NPO status , Patient's Chart, lab work & pertinent test results  History of Anesthesia Complications Negative for: history of anesthetic complications  Airway Mallampati: II  TM Distance: >3 FB Neck ROM: Full    Dental  (+) Teeth Intact, Dental Advisory Given   Pulmonary Current Smoker,    Pulmonary exam normal       Cardiovascular hypertension, Pt. on medications Normal cardiovascular exam    Neuro/Psych negative psych ROS   GI/Hepatic negative GI ROS, Neg liver ROS,   Endo/Other  negative endocrine ROS  Renal/GU      Musculoskeletal   Abdominal   Peds  Hematology   Anesthesia Other Findings   Reproductive/Obstetrics                             Anesthesia Physical Anesthesia Plan  ASA: II  Anesthesia Plan: General   Post-op Pain Management:    Induction: Intravenous  Airway Management Planned: Oral ETT  Additional Equipment:   Intra-op Plan:   Post-operative Plan: Extubation in OR  Informed Consent: I have reviewed the patients History and Physical, chart, labs and discussed the procedure including the risks, benefits and alternatives for the proposed anesthesia with the patient or authorized representative who has indicated his/her understanding and acceptance.   Dental advisory given  Plan Discussed with: CRNA, Anesthesiologist and Surgeon  Anesthesia Plan Comments:         Anesthesia Quick Evaluation

## 2014-12-03 NOTE — Transfer of Care (Signed)
Immediate Anesthesia Transfer of Care Note  Patient: QADIR KAPPLE  Procedure(s) Performed: Procedure(s): INTRAMEDULLARY (IM) NAIL TIBIAL (Right) OPEN REDUCTION INTERNAL FIXATION (ORIF) FIBULA FRACTURE (Right) IRRIGATION AND DEBRIDEMENT CALCANEOUS FRACTURE (Right)  Patient Location: PACU  Anesthesia Type:General  Level of Consciousness: awake and alert   Airway & Oxygen Therapy: Patient Spontanous Breathing and Patient connected to nasal cannula oxygen  Post-op Assessment: Report given to RN and Post -op Vital signs reviewed and stable  Post vital signs: Reviewed and stable  Last Vitals:  Filed Vitals:   12/03/14 1300  BP: 149/72  Pulse: 61  Temp: 37.3 C  Resp: 18    Complications: No apparent anesthesia complications
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
## 2014-12-03 NOTE — Progress Notes (Signed)
Orthopedic Tech Progress Note Patient Details:  John Cantu 10/06/1960 [id number]  Ortho Devices Type of Ortho Device: Thumb spica splint Splint Material: Fiberglass Ortho Device/Splint Location: applied overhead frame to bed Ortho Device/Splint Interventions: Ordered, Application   [name] 12/03/2014, 9:46 PM

## 2014-12-03 NOTE — Progress Notes (Signed)
Orthopedic Tech Progress Note Patient Details:  John Cantu 04-24-1961 [id number] Delivered prafo boot to OR. Patient ID: John Cantu, male   DOB: 1961-01-26, 54 y.o.   MRN: [id number]   [name] 12/03/2014, 7:35 PM
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
## 2014-12-04 ENCOUNTER — Encounter (HOSPITAL_COMMUNITY): Payer: Self-pay | Admitting: Orthopedic Surgery

## 2014-12-04 DIAGNOSIS — S92001B Unspecified fracture of right calcaneus, initial encounter for open fracture: Secondary | ICD-10-CM

## 2014-12-04 DIAGNOSIS — S62309A Unspecified fracture of unspecified metacarpal bone, initial encounter for closed fracture: Secondary | ICD-10-CM

## 2014-12-04 DIAGNOSIS — S82891A Other fracture of right lower leg, initial encounter for closed fracture: Secondary | ICD-10-CM

## 2014-12-04 DIAGNOSIS — I10 Essential (primary) hypertension: Secondary | ICD-10-CM | POA: Diagnosis present

## 2014-12-04 DIAGNOSIS — S82201A Unspecified fracture of shaft of right tibia, initial encounter for closed fracture: Secondary | ICD-10-CM | POA: Diagnosis present

## 2014-12-04 HISTORY — DX: Unspecified fracture of right calcaneus, initial encounter for open fracture: S92.001B

## 2014-12-04 HISTORY — DX: Unspecified fracture of unspecified metacarpal bone, initial encounter for closed fracture: S62.309A

## 2014-12-04 HISTORY — DX: Other fracture of right lower leg, initial encounter for closed fracture: S82.891A

## 2014-12-04 HISTORY — DX: Unspecified fracture of shaft of right tibia, initial encounter for closed fracture: S82.201A

## 2014-12-04 LAB — GENTAMICIN LEVEL, RANDOM: GENTAMICIN RM: 2.2 ug/mL

## 2014-12-04 LAB — BASIC METABOLIC PANEL
Anion gap: 12 (ref 5–15)
BUN: 13 mg/dL (ref 6–20)
CO2: 27 mmol/L (ref 22–32)
CREATININE: 1.19 mg/dL (ref 0.61–1.24)
Calcium: 8.8 mg/dL — ABNORMAL LOW (ref 8.9–10.3)
Chloride: 102 mmol/L (ref 101–111)
GFR calc non Af Amer: >60 mL/min (ref >60)
Glucose, Bld: 136 mg/dL — ABNORMAL HIGH (ref 65–99)
Potassium: 3.7 mmol/L (ref 3.5–5.1)
Sodium: 141 mmol/L (ref 135–145)

## 2014-12-04 MED ORDER — LISINOPRIL 20 MG PO TABS
20.0000 mg | ORAL_TABLET | Freq: Every day | ORAL | Status: DC
  Administered 2014-12-04 – 2014-12-05 (×2): 20 mg via ORAL
  Filled 2014-12-04 (×2): qty 1

## 2014-12-04 MED ORDER — TRIAMTERENE-HCTZ 75-50 MG PO TABS
1.0000 | ORAL_TABLET | Freq: Every day | ORAL | Status: DC
  Administered 2014-12-04 – 2014-12-05 (×2): 1 via ORAL
  Filled 2014-12-04 (×2): qty 1

## 2014-12-04 MED ORDER — OXYCODONE HCL 5 MG PO TABS: 5.0000 mg | ORAL_TABLET | Freq: Four times a day (QID) | ORAL | Status: AC | PRN

## 2014-12-04 MED ORDER — DOCUSATE SODIUM 100 MG PO CAPS: 100.0000 mg | ORAL_CAPSULE | Freq: Two times a day (BID) | ORAL | Status: AC

## 2014-12-04 MED ORDER — ENOXAPARIN SODIUM 40 MG/0.4ML ~~LOC~~ SOLN: 40.0000 mg | SUBCUTANEOUS | Status: AC

## 2014-12-04 MED ORDER — METHOCARBAMOL 500 MG PO TABS: 500.0000 mg | ORAL_TABLET | Freq: Four times a day (QID) | ORAL | Status: AC | PRN

## 2014-12-04 MED ORDER — AMLODIPINE BESYLATE 10 MG PO TABS
10.0000 mg | ORAL_TABLET | Freq: Every day | ORAL | Status: DC
  Administered 2014-12-04 – 2014-12-05 (×2): 10 mg via ORAL
  Filled 2014-12-04 (×2): qty 1

## 2014-12-04 MED ORDER — OXYCODONE-ACETAMINOPHEN 5-325 MG PO TABS: 1.0000 | ORAL_TABLET | Freq: Four times a day (QID) | ORAL | Status: AC | PRN
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
## 2014-12-04 NOTE — Progress Notes (Signed)
OT Cancellation Note  Patient Details Name: John Cantu MRN: [id number] DOB: 03/28/1961   Cancelled Treatment:    Reason Eval/Treat Not Completed: Other (comment). Pt talking on the phone and unwilling to end phone call at this time, will re attempt later today as time allows  [name] 12/04/2014, 1:45 PM

## 2014-12-04 NOTE — Care Management Note (Signed)
Case Management Note  Patient Details  Name: John Cantu MRN: [id number] Date of Birth: 1961/02/09  Subjective/Objective:      S/p IM nailing right tibia, ORIF right ankle              Action/Plan: Spoke with patient about home care, he selected [facility]. Contacted Miranda at [facility] and set up HHPT and HHRN. Contacted James with [facility] and requested 3N1,  lightweight wheelchair, and rolling walker with left platform. Patient states that his brother will be picking him up in a truck and will be able to transport equipment. Patient states that he will have assistance at home after discharge.CM will continue to follow.    Expected Discharge Date:                  Expected Discharge Plan:  Home w Home Health Services  In-House Referral:  NA  Discharge planning Services  CM Consult  Post Acute Care Choice:  Durable Medical Equipment, Home Health Choice offered to:  Patient  DME Arranged:  3-N-1, Scientific laboratory technician, Civil Service fast streamer, Community education officer wheelchair with seat cushion DME Agency:  [facility].  HH Arranged:  Charity fundraiser, PT HH Agency:  [facility]  Status of Service:  In process, will continue to follow  Medicare Important Message Given:    Date Medicare IM Given:    Medicare IM give by:    Date Additional Medicare IM Given:    Additional Medicare Important Message give by:     If discussed at Long Length of Stay Meetings, dates discussed:    Additional Comments:  Monica Becton, RN 12/04/2014, 3:41 PM

## 2014-12-04 NOTE — Discharge Instructions (Signed)
Orthopaedic Trauma Service Discharge Instructions   General Discharge Instructions  WEIGHT BEARING STATUS: Nonweightbearing right leg and left hand. Platform walker for mobilization  RANGE OF MOTION/ACTIVITY: Unrestricted right knee range of motion. No ankle range of motion at this time. activity as tolerated while maintaining restrictions  PAIN MEDICATION USE AND EXPECTATIONS  You have likely been given narcotic medications to help control your pain.  After a traumatic event that results in an fracture (broken bone) with or without surgery, it is ok to use narcotic pain medications to help control one's pain.  We understand that everyone responds to pain differently and each individual patient will be evaluated on a regular basis for the continued need for narcotic medications. Ideally, narcotic medication use should last no more than 6-8 weeks (coinciding with fracture healing).   As a patient it is your responsibility as well to monitor narcotic medication use and report the amount and frequency you use these medications when you come to your office visit.   We would also advise that if you are using narcotic medications, you should take a dose prior to therapy to maximize you participation.  IF YOU ARE ON NARCOTIC MEDICATIONS IT IS NOT PERMISSIBLE TO OPERATE A MOTOR VEHICLE (MOTORCYCLE/CAR/TRUCK/MOPED) OR HEAVY MACHINERY DO NOT MIX NARCOTICS WITH OTHER CNS (CENTRAL NERVOUS SYSTEM) DEPRESSANTS SUCH AS ALCOHOL  Diet: as you were eating previously.  Can use over the counter stool softeners and bowel preparations, such as Miralax, to help with bowel movements.  Narcotics can be constipating.  Be sure to drink plenty of fluids  Wound Care:Wound care as follows to right heel:    Change dressing every other day starting on 12/07/2014     Mepitel over the wound, 4 x 4's, ABDs, Ace wrap and placed back into PRAFO boot    Do not apply any ointments or solutions to the wound. Okay to clean gently with  soap and water only and dry well before applying dressing    Please see detailed instructions below  STOP SMOKING OR USING NICOTINE PRODUCTS!!!!  As discussed nicotine severely impairs your body's ability to heal surgical and traumatic wounds but also impairs bone healing.  Wounds and bone heal by forming microscopic blood vessels (angiogenesis) and nicotine is a vasoconstrictor (essentially, shrinks blood vessels).  Therefore, if vasoconstriction occurs to these microscopic blood vessels they essentially disappear and are unable to deliver necessary nutrients to the healing tissue.  This is one modifiable factor that you can do to dramatically increase your chances of healing your injury.    (This means no smoking, no nicotine gum, patches, etc)  DO NOT USE NONSTEROIDAL ANTI-INFLAMMATORY DRUGS (NSAID'S)  Using products such as Advil (ibuprofen), Aleve (naproxen), Motrin (ibuprofen) for additional pain control during fracture healing can delay and/or prevent the healing response.  If you would like to take over the counter (OTC) medication, Tylenol (acetaminophen) is ok.  However, some narcotic medications that are given for pain control contain acetaminophen as well. Therefore, you should not exceed more than 4000 mg of tylenol in a day if you do not have liver disease.  Also note that there are may OTC medicines, such as cold medicines and allergy medicines that my contain tylenol as well.  If you have any questions about medications and/or interactions please ask your doctor/PA or your pharmacist.      ICE AND ELEVATE INJURED/OPERATIVE EXTREMITY  Using ice and elevating the injured extremity above your heart can help with swelling and pain control.  Icing in a pulsatile fashion, such as 20 minutes on and 20 minutes off, can be followed.    Do not place ice directly on skin. Make sure there is a barrier between to skin and the ice pack.    Using frozen items such as frozen peas works well as the  conform nicely to the are that needs to be iced.  USE AN ACE WRAP OR TED HOSE FOR SWELLING CONTROL  In addition to icing and elevation, Ace wraps or TED hose are used to help limit and resolve swelling.  It is recommended to use Ace wraps or TED hose until you are informed to stop.    When using Ace Wraps start the wrapping distally (farthest away from the body) and wrap proximally (closer to the body)   Example: If you had surgery on your leg or thing and you do not have a splint on, start the ace wrap at the toes and work your way up to the thigh        If you had surgery on your upper extremity and do not have a splint on, start the ace wrap at your fingers and work your way up to the upper arm  IF YOU ARE IN A SPLINT OR CAST DO NOT REMOVE IT FOR ANY REASON   If your splint gets wet for any reason please contact the office immediately. You may shower in your splint or cast as long as you keep it dry.  This can be done by wrapping in a cast cover or garbage back (or similar)  Do Not stick any thing down your splint or cast such as pencils, money, or hangers to try and scratch yourself with.  If you feel itchy take benadryl as prescribed on the bottle for itching  IF YOU ARE IN A CAM BOOT (BLACK BOOT)  You may remove boot periodically. Perform daily dressing changes as noted below.  Wash the liner of the boot regularly and wear a sock when wearing the boot. It is recommended that you sleep in the boot until told otherwise  CALL THE OFFICE WITH ANY QUESTIONS OR CONCERTS: [phone]     Discharge Pin Site Instructions  Dress pins daily with Kerlix roll starting on POD 2. Wrap the Kerlix so that it tamps the skin down around the pin-skin interface to prevent/limit motion of the skin relative to the pin.  (Pin-skin motion is the primary cause of pain and infection related to external fixator pin sites).  Remove any crust or coagulum that may obstruct drainage with a saline moistened gauze or  soap and water.  After POD 3, if there is no discernable drainage on the pin site dressing, the interval for change can by increased to every other day.  You may shower with the fixator, cleaning all pin sites gently with soap and water.  If you have a surgical wound this needs to be completely dry and without drainage before showering.  The extremity can be lifted by the fixator to facilitate wound care and transfers.  Notify the office/Doctor if you experience increasing drainage, redness, or pain from a pin site, or if you notice purulent (thick, snot-like) drainage.  Discharge Wound Care Instructions  Do NOT apply any ointments, solutions or lotions to pin sites or surgical wounds.  These prevent needed drainage and even though solutions like hydrogen peroxide kill bacteria, they also damage cells lining the pin sites that help fight infection.  Applying lotions or ointments can  keep the wounds moist and can cause them to breakdown and open up as well. This can increase the risk for infection. When in doubt call the office.  Surgical incisions should be dressed daily.  If any drainage is noted, use one layer of adaptic, then gauze, Kerlix, and an ace wrap.  Once the incision is completely dry and without drainage, it may be left open to air out.  Showering may begin 36-48 hours later.  Cleaning gently with soap and water.  Traumatic wounds should be dressed daily as well.    One layer of adaptic, gauze, Kerlix, then ace wrap.  The adaptic can be discontinued once the draining has ceased    If you have a wet to dry dressing: wet the gauze with saline the squeeze as much saline out so the gauze is moist (not soaking wet), place moistened gauze over wound, then place a dry gauze over the moist one, followed by Kerlix wrap, then ace wrap.
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
## 2014-12-05 LAB — BASIC METABOLIC PANEL
Anion gap: 9 (ref 5–15)
BUN: 14 mg/dL (ref 6–20)
CO2: 27 mmol/L (ref 22–32)
CREATININE: 1.09 mg/dL (ref 0.61–1.24)
Calcium: 8.1 mg/dL — ABNORMAL LOW (ref 8.9–10.3)
Chloride: 102 mmol/L (ref 101–111)
GFR calc non Af Amer: >60 mL/min (ref >60)
GLUCOSE: 125 mg/dL — AB (ref 65–99)
POTASSIUM: 3.4 mmol/L — AB (ref 3.5–5.1)
Sodium: 138 mmol/L (ref 135–145)

## 2014-12-05 LAB — CBC
HCT: 33.5 % — ABNORMAL LOW (ref 39.0–52.0)
Hemoglobin: 11.7 g/dL — ABNORMAL LOW (ref 13.0–17.0)
MCH: 32.7 pg (ref 26.0–34.0)
MCHC: 34.9 g/dL (ref 30.0–36.0)
MCV: 93.6 fL (ref 78.0–100.0)
PLATELETS: 144 10*3/uL — AB (ref 150–400)
RBC: 3.58 MIL/uL — AB (ref 4.22–5.81)
RDW: 14.1 % (ref 11.5–15.5)
WBC: 11.1 10*3/uL — AB (ref 4.0–10.5)
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
## 2014-12-05 NOTE — Progress Notes (Signed)
Subjective: 2 Days Post-Op Procedure(s) (LRB): INTRAMEDULLARY (IM) NAIL TIBIAL (Right) OPEN REDUCTION INTERNAL FIXATION (ORIF) FIBULA FRACTURE (Right) IRRIGATION AND DEBRIDEMENT CALCANEOUS FRACTURE (Right) Patient reports pain as mild.    Objective: Vital signs in last 24 hours: Temp:  [98.2 F (36.8 C)-99.5 F (37.5 C)] 98.2 F (36.8 C) (06/25 0538) Pulse Rate:  [69-83] 69 (06/25 0538) Resp:  [16-18] 18 (06/25 0538) BP: (97-152)/(65-70) 97/70 mmHg (06/25 0538) SpO2:  [96 %-99 %] 99 % (06/25 0538)  Intake/Output from previous day: 06/24 0701 - 06/25 0700 In: 1051 [P.O.:240; I.V.:761; IV Piggyback:50] Out: 1200 [Urine:1200] Intake/Output this shift:     Recent Labs  12/02/14 1817 12/02/14 1825 12/03/14 0411 12/05/14 0515  HGB 15.4 15.3 15.1 11.7*    Recent Labs  12/03/14 0411 12/05/14 0515  WBC 12.1* 11.1*  RBC 4.60 3.58*  HCT 42.8 33.5*  PLT 168 144*    Recent Labs  12/04/14 0337 12/05/14 0515  NA 141 138  K 3.7 3.4*  CL 102 102  CO2 27 27  BUN 13 14  CREATININE 1.19 1.09  GLUCOSE 136* 125*  CALCIUM 8.8* 8.1*    Recent Labs  12/03/14 0411  INR 1.20    Neurovascular intact Sensation intact distally Intact pulses distally Incision: moderate drainage No cellulitis present Compartment soft  Assessment/Plan: 2 Days Post-Op Procedure(s) (LRB): INTRAMEDULLARY (IM) NAIL TIBIAL (Right) OPEN REDUCTION INTERNAL FIXATION (ORIF) FIBULA FRACTURE (Right) IRRIGATION AND DEBRIDEMENT CALCANEOUS FRACTURE (Right) D/c home today rx in chart RLE dsg changed today   Margart Sickles 12/05/2014, 12:03 PM

## 2014-12-05 NOTE — Care Management Note (Signed)
Case Management Note  Patient Details  Name: DALTIN SERANO MRN: [id number] Date of Birth: [birth year]  Subjective/Objective:                  S/p IM nailing right tibia, ORIF right ankle  Action/Plan: CM called and spoke to Clydie Braun, Charity fundraiser and advised that the ortho tech needed to be called to deliver crutches. CM offered to call but Clydie Braun accepted the message and states that she will take care of it. No further CM needs identified per patient. CM available if additional needs arise.    Expected Discharge Date:  12/05/14              Expected Discharge Plan:  Home w Home Health Services  In-House Referral:  NA  Discharge planning Services  CM Consult  Post Acute Care Choice:  Durable Medical Equipment, Home Health Choice offered to:  Patient  DME Arranged:  3-N-1, Scientific laboratory technician, Civil Service fast streamer, Community education officer wheelchair with seat cushion DME Agency:  [facility].  HH Arranged:  Charity fundraiser, PT HH Agency:  [facility]  Status of Service:  Completed, Signed off Medicare Important Message Given:    Date Medicare IM Given:    Medicare IM give by:    Date Additional Medicare IM Given:    Additional Medicare Important Message give by:     If discussed at Long Length of Stay Meetings, dates discussed:    Additional Comments:  Darcel Smalling, RN 12/05/2014, 10:23 AM

## 2014-12-05 NOTE — Progress Notes (Signed)
Orthopedic Tech Progress Note Patient Details:  John Cantu 03-28-1961 [id number]  Ortho Devices Type of Ortho Device: Thumb spica splint Splint Material: Fiberglass Ortho Device/Splint Location: applied overhead frame to bed Ortho Device/Splint Interventions: Application Crutches underarm non wood pair; viewed order from doctor's order list  Nikki Dom 12/05/2014, 11:20 AM

## 2014-12-05 NOTE — Progress Notes (Signed)
OT Cancellation Note and Discharge  Patient Details Name: VADA LINHARDT MRN: [id number] DOB: 09-23-1960   Cancelled Treatment:    Reason Eval/Treat Not Completed: OT screened, no needs identified, will sign off. Pt reports that he has ample A at home for BADLs if he needs it. DME already delivered to room and pt also plans to take urinal home to use at night.   Evette Georges [phone] 12/05/2014, 1:16 PM

## 2014-12-05 NOTE — Progress Notes (Signed)
CM spoke to patient at the bedside who states that his equipment has not been delivered. CM called Rep Fayrene Fearing with [facility] for 3N1, lightweight wheelchair with cushion, and rolling walker with left platform. The pt states that he would like to have crutches at home if they are available. James with [facility] states they do not supply crutches. Patient states that his brother will be transporting him home in a truck therefore will have space for equipment. CM outreach to portable equipment with the hospital who states they do not supply the crutches. CM will continue to monitor for additional needs.

## 2014-12-09 ENCOUNTER — Encounter (HOSPITAL_COMMUNITY): Payer: Self-pay | Admitting: Orthopedic Surgery

## 2014-12-10 ENCOUNTER — Encounter (HOSPITAL_COMMUNITY): Payer: Self-pay | Admitting: Orthopedic Surgery
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
## 2016-01-04 IMAGING — CR DG TIBIA/FIBULA PORT 2V*R*
1 series · 1 of 1 positions shown · non-contrast
Comparison: Intraoperative radiographs performed earlier today at
[DATE] p.m.

CLINICAL DATA: Status post internal fixation of right tibial
fracture.

EXAM:
PORTABLE RIGHT TIBIA AND FIBULA - 2 VIEW

[AP]
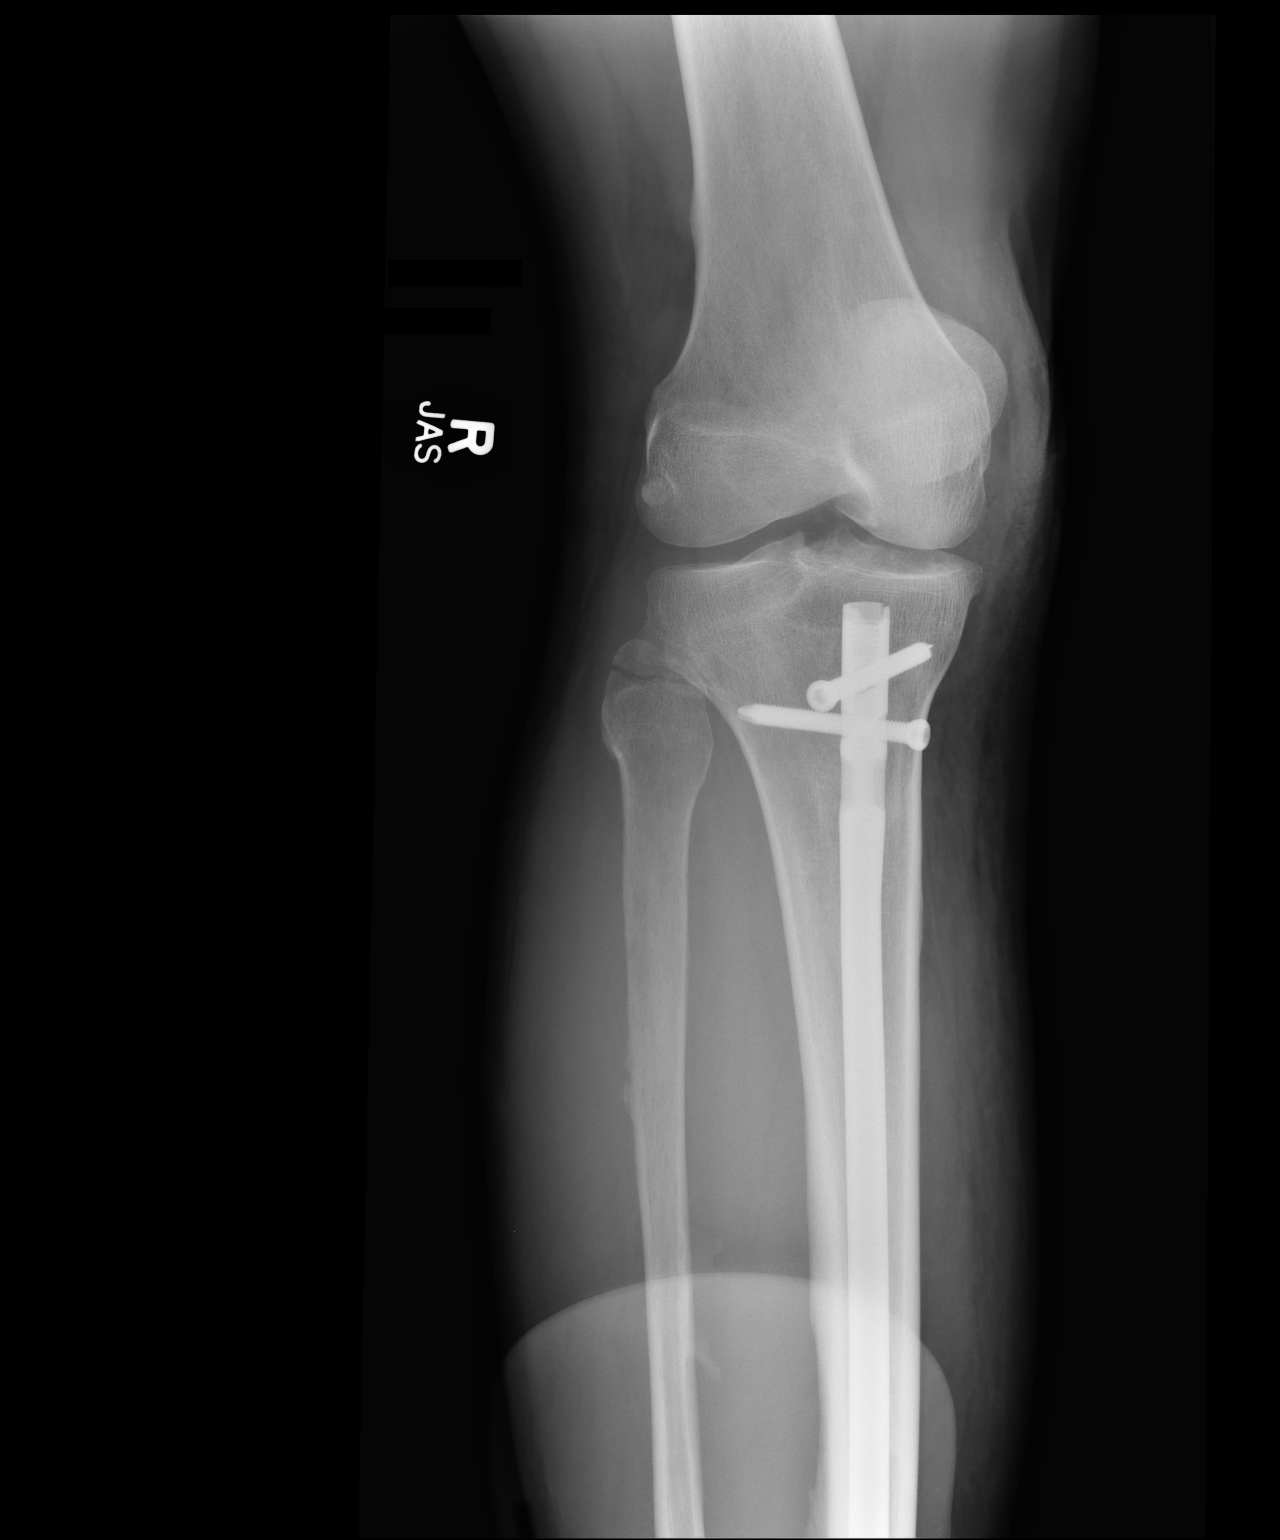

[1 of 1 positions shown; findings below may reference images not displayed]

FINDINGS: An intramedullary rod and screws are seen transfixing the patient's
distal tibial fracture in grossly anatomic alignment. A plate and
screws are noted along the distal fibula. There is approximately [DATE]
shaft width persistent medial displacement of the distal fibular
fragment. A small fibular head fracture is also seen, with minimal
displacement.

Vague soft tissue swelling is suggested about the right lower leg.
IMPRESSION: 1. Status post internal fixation of distal tibial fracture in
grossly anatomic alignment.
2. [DATE] shaft width persistent medial displacement of the patient's
distal fibular fragment, and minimally displaced small fibular head
fracture also seen.

## 2022-12-04 ENCOUNTER — Encounter (INDEPENDENT_AMBULATORY_CARE_PROVIDER_SITE_OTHER): Payer: 59 | Admitting: Ophthalmology

## 2022-12-04 DIAGNOSIS — H35033 Hypertensive retinopathy, bilateral: Secondary | ICD-10-CM | POA: Diagnosis not present

## 2022-12-04 DIAGNOSIS — I10 Essential (primary) hypertension: Secondary | ICD-10-CM | POA: Diagnosis not present

## 2022-12-04 DIAGNOSIS — H35372 Puckering of macula, left eye: Secondary | ICD-10-CM | POA: Diagnosis not present

## 2022-12-04 DIAGNOSIS — H353112 Nonexudative age-related macular degeneration, right eye, intermediate dry stage: Secondary | ICD-10-CM | POA: Diagnosis not present

## 2023-04-05 ENCOUNTER — Encounter (INDEPENDENT_AMBULATORY_CARE_PROVIDER_SITE_OTHER): Payer: 59 | Admitting: Ophthalmology

## 2023-04-05 DIAGNOSIS — I10 Essential (primary) hypertension: Secondary | ICD-10-CM

## 2023-04-05 DIAGNOSIS — H43813 Vitreous degeneration, bilateral: Secondary | ICD-10-CM

## 2023-04-05 DIAGNOSIS — H35372 Puckering of macula, left eye: Secondary | ICD-10-CM | POA: Diagnosis not present

## 2023-04-05 DIAGNOSIS — H2513 Age-related nuclear cataract, bilateral: Secondary | ICD-10-CM

## 2023-04-05 DIAGNOSIS — H35033 Hypertensive retinopathy, bilateral: Secondary | ICD-10-CM | POA: Diagnosis not present

## 2023-05-07 ENCOUNTER — Encounter (INDEPENDENT_AMBULATORY_CARE_PROVIDER_SITE_OTHER): Payer: 59 | Admitting: Ophthalmology

## 2023-05-07 DIAGNOSIS — I10 Essential (primary) hypertension: Secondary | ICD-10-CM | POA: Diagnosis not present

## 2023-05-07 DIAGNOSIS — H43813 Vitreous degeneration, bilateral: Secondary | ICD-10-CM | POA: Diagnosis not present

## 2023-05-07 DIAGNOSIS — H35372 Puckering of macula, left eye: Secondary | ICD-10-CM | POA: Diagnosis not present

## 2023-05-07 DIAGNOSIS — H35033 Hypertensive retinopathy, bilateral: Secondary | ICD-10-CM | POA: Diagnosis not present

## 2023-08-07 ENCOUNTER — Encounter (INDEPENDENT_AMBULATORY_CARE_PROVIDER_SITE_OTHER): Payer: 59 | Admitting: Ophthalmology

## 2023-08-07 DIAGNOSIS — H35372 Puckering of macula, left eye: Secondary | ICD-10-CM | POA: Diagnosis not present

## 2023-08-07 DIAGNOSIS — H35033 Hypertensive retinopathy, bilateral: Secondary | ICD-10-CM

## 2023-08-07 DIAGNOSIS — I10 Essential (primary) hypertension: Secondary | ICD-10-CM

## 2023-08-07 DIAGNOSIS — H43813 Vitreous degeneration, bilateral: Secondary | ICD-10-CM | POA: Diagnosis not present

## 2023-10-04 ENCOUNTER — Encounter (INDEPENDENT_AMBULATORY_CARE_PROVIDER_SITE_OTHER): Payer: 59 | Admitting: Ophthalmology

## 2024-02-05 ENCOUNTER — Encounter (INDEPENDENT_AMBULATORY_CARE_PROVIDER_SITE_OTHER): Payer: 59 | Admitting: Ophthalmology

## 2024-02-05 DIAGNOSIS — H43813 Vitreous degeneration, bilateral: Secondary | ICD-10-CM

## 2024-02-05 DIAGNOSIS — H35372 Puckering of macula, left eye: Secondary | ICD-10-CM | POA: Diagnosis not present

## 2024-02-05 DIAGNOSIS — H35033 Hypertensive retinopathy, bilateral: Secondary | ICD-10-CM | POA: Diagnosis not present

## 2024-02-05 DIAGNOSIS — I10 Essential (primary) hypertension: Secondary | ICD-10-CM

## 2024-08-05 ENCOUNTER — Encounter (INDEPENDENT_AMBULATORY_CARE_PROVIDER_SITE_OTHER): Admitting: Ophthalmology
